# Patient Record
Sex: Male | Born: 2000 | Race: Black or African American | Hispanic: No | Marital: Single | Smoking: Never smoker
Health system: Southern US, Community
[De-identification: ages and names within clinical notes are randomized; demographics above are authoritative.]

---
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
## 2023-01-27 ENCOUNTER — Emergency Department (HOSPITAL_BASED_OUTPATIENT_CLINIC_OR_DEPARTMENT_OTHER)
Admission: EM | Admit: 2023-01-27 | Discharge: 2023-01-27 | Disposition: A | Discharge status: Home / Self Care | Payer: Self-pay | Attending: Emergency Medicine | Admitting: Emergency Medicine

## 2023-01-27 ENCOUNTER — Emergency Department (HOSPITAL_BASED_OUTPATIENT_CLINIC_OR_DEPARTMENT_OTHER): Payer: Self-pay

## 2023-01-27 ENCOUNTER — Other Ambulatory Visit: Payer: Self-pay

## 2023-01-27 DIAGNOSIS — R9431 Abnormal electrocardiogram [ECG] [EKG]: Secondary | ICD-10-CM | POA: Insufficient documentation

## 2023-01-27 LAB — CBC
HCT: 43.4 % (ref 39.0–52.0)
Hemoglobin: 15 g/dL (ref 13.0–17.0)
MCH: 31 pg (ref 26.0–34.0)
MCHC: 34.6 g/dL (ref 30.0–36.0)
MCV: 89.7 fL (ref 80.0–100.0)
Platelets: 278 10*3/uL (ref 150–400)
RBC: 4.84 MIL/uL (ref 4.22–5.81)
RDW: 10.8 % — ABNORMAL LOW (ref 11.5–15.5)
WBC: 4.9 10*3/uL (ref 4.0–10.5)
nRBC: 0 % (ref 0.0–0.2)

## 2023-01-27 LAB — BASIC METABOLIC PANEL
Anion gap: 11 (ref 5–15)
BUN: 14 mg/dL (ref 6–20)
CO2: 22 mmol/L (ref 22–32)
Calcium: 10 mg/dL (ref 8.9–10.3)
Chloride: 101 mmol/L (ref 98–111)
Creatinine, Ser: 1.22 mg/dL (ref 0.61–1.24)
GFR, Estimated: >60 mL/min (ref >60)
Glucose, Bld: 92 mg/dL (ref 70–99)
Potassium: 3.8 mmol/L (ref 3.5–5.1)
Sodium: 134 mmol/L — ABNORMAL LOW (ref 135–145)

## 2023-01-27 LAB — CBG MONITORING, ED: Glucose-Capillary: 76 mg/dL (ref 70–99)

## 2023-01-27 LAB — TROPONIN I (HIGH SENSITIVITY): Troponin I (High Sensitivity): <2 ng/L (ref <18)

## 2023-01-27 MED ORDER — HYDROXYZINE HCL 25 MG PO TABS: 25.0000 mg | ORAL_TABLET | Freq: Four times a day (QID) | ORAL | 0 refills | Status: AC

## 2023-01-27 NOTE — ED Notes (Signed)
Patient feels like his K+ is low as well. EDP made aware

## 2023-01-27 NOTE — ED Notes (Signed)
Patient transported to X-ray 

## 2023-01-27 NOTE — ED Triage Notes (Signed)
Patient presents to ED via POV from home. Here due to a 30-60 second episode of shortness of breath when patient woke up this morning. Denies shortness of breath at this time. Able to speak in full, complete sentences. Denies pain. Denies any complaints.

## 2023-01-27 NOTE — ED Notes (Signed)
Pt states he feels his blood sugar is low, requesting to have it checked

## 2023-01-27 NOTE — Discharge Instructions (Addendum)
You have a slightly abnormal looking EKG however it is nonconcerning for ischemia/damage.  Ultimately I would follow-up with your primary care doctor and discussed the pros and cons of getting an ultrasound of your heart in order to be very thorough.  However, your symptoms do not sound cardiac/heart related.  I suspect that there may be an element of anxiety at play.  I have written you prescription for Atarax/Vistaril which is a medicine you can take for anxiety as needed up to once every 6 hours. Return to the emergency room for any new or concerning symptoms.  If you do not have a primary care provider I have given you the information for the [place] [facility] wellness clinic however if you do have insurance I would recommend calling your insurance company to see who is within network in this area.

## 2023-01-28 NOTE — ED Provider Notes (Signed)
[place] [facility] Provider Note   CSN: [id number] Arrival date & time: 01/27/23  1338     History  Chief Complaint  Patient presents with   Shortness of Breath    Dustin Tanner is a 22 y.o. male.   Shortness of Breath Patient is a 22 year old male with no pertinent past medical history present emergency room today with complaints of several episodes lasting approximately 30 to 60 seconds of dyspnea over the past week.  Seems that the most recent episode was this morning.  Seems that happens once per day particularly in the morning.  No associated chest pain no lightheadedness or dizziness no palpitations.  He has no history of syncope, and he played football in high school.  Denies any leg swelling nausea or vomiting.  No other associate symptoms, he is symptom-free at this time.  He does smoke marijuana but does not smoke cigarettes.     Home Medications Prior to Admission medications   Medication Sig Start Date End Date Taking? Authorizing Provider  hydrOXYzine (ATARAX) 25 MG tablet Take 1 tablet (25 mg total) by mouth every 6 (six) hours. 01/27/23  Yes Gailen Shelter, PA      Allergies    Patient has no known allergies.    Review of Systems   Review of Systems  Respiratory:  Positive for shortness of breath.     Physical Exam Updated Vital Signs BP 120/64 (BP Location: Left Arm)   Pulse 85   Temp 98.7 F (37.1 C) (Oral)   Resp 16   SpO2 99%  Physical Exam Vitals and nursing note reviewed.  Constitutional:      General: He is not in acute distress.    Comments: Pleasant well-appearing 22 year old.  In no acute distress.  Sitting comfortably in bed.  Able answer questions appropriately follow commands. No increased work of breathing. Speaking in full sentences.   HENT:     Head: Normocephalic and atraumatic.     Nose: Nose normal.  Eyes:     General: No scleral icterus. Cardiovascular:     Rate and Rhythm: Normal rate  and regular rhythm.     Pulses: Normal pulses.     Heart sounds: Normal heart sounds.  Pulmonary:     Effort: Pulmonary effort is normal. No respiratory distress.     Breath sounds: No wheezing.  Abdominal:     Palpations: Abdomen is soft.     Tenderness: There is no abdominal tenderness.  Musculoskeletal:     Cervical back: Normal range of motion.     Right lower leg: No edema.     Left lower leg: No edema.  Skin:    General: Skin is warm and dry.     Capillary Refill: Capillary refill takes less than 2 seconds.  Neurological:     Mental Status: He is alert. Mental status is at baseline.  Psychiatric:        Mood and Affect: Mood normal.        Behavior: Behavior normal.     ED Results / Procedures / Treatments   Labs (all labs ordered are listed, but only abnormal results are displayed) Labs Reviewed  BASIC METABOLIC PANEL - Abnormal; Notable for the following components:      Result Value   Sodium 134 (*)    All other components within normal limits  CBC - Abnormal; Notable for the following components:   RDW 10.8 (*)    All other  components within normal limits  CBG MONITORING, ED  TROPONIN I (HIGH SENSITIVITY)    EKG EKG Interpretation Date/Time:  Monday January 27 2023 16:46:10 EDT Ventricular Rate:  78 PR Interval:  158 QRS Duration:  87 QT Interval:  343 QTC Calculation: 391 R Axis:   83  Text Interpretation: Sinus rhythm no significant change since earlier in the day Confirmed by Pricilla Loveless [phone]) on 01/28/2023 7:44:32 AM  Radiology DG Chest 1 View  Result Date: 01/27/2023 CLINICAL DATA:  Shortness of breath. EXAM: CHEST  1 VIEW COMPARISON:  02/12/2015 FINDINGS: The cardiomediastinal contours are normal. The lungs are clear. Pulmonary vasculature is normal. No consolidation, pleural effusion, or pneumothorax. No acute osseous abnormalities are seen. IMPRESSION: Negative radiograph of the chest. Electronically Signed   By: Narda Rutherford M.D.   On:  01/27/2023 17:54    Procedures Procedures    Medications Ordered in ED Medications - No data to display  ED Course/ Medical Decision Making/ A&P                             Medical Decision Making Amount and/or Complexity of Data Reviewed Labs: ordered. Radiology: ordered.  Risk Prescription drug management.   Patient is a 22 year old male with no pertinent past medical history present emergency room today with complaints of several episodes lasting approximately 30 to 60 seconds of dyspnea over the past week.  Seems that the most recent episode was this morning.  Seems that happens once per day particularly in the morning.  No associated chest pain no lightheadedness or dizziness no palpitations.  He has no history of syncope, and he played football in high school.  Denies any leg swelling nausea or vomiting.  No other associate symptoms, he is symptom-free at this time.  He does smoke marijuana but does not smoke cigarettes.  Physical exam is unremarkable regular rate and rhythm no murmurs rubs or gallops.  He is well-appearing no calf swelling or tenderness  His chest x-ray is unremarkable no infiltrates or asymmetry of the lungs.  Cardiac contour is normal.  EKG with some inferior Q waves and some LVH but otherwise normal.  Basic labs and troponin are normal.  Vital signs normal  Patient is symptom-free and I believe that he is reasonable to go home with follow-up with primary care -I do not feel that he specifically needs to see a cardiologist however he may benefit from an echocardiogram in the future.  Return precautions discussed.  I discussed this case with my supervising physician who agrees with my plan.  Will discharge home with Atarax as some of his symptoms may be anxiety related.  EKG slightly abnormal however this is perhaps unrelated to his symptoms.  I was able to obtain an EKG from 2016 which appears quite similar.  Ultimately outpatient follow-up and monitoring  his symptoms are next steps.  Final Clinical Impression(s) / ED Diagnoses Final diagnoses:  Abnormal EKG    Rx / DC Orders ED Discharge Orders          Ordered    hydrOXYzine (ATARAX) 25 MG tablet  Every 6 hours        01/27/23 1853              Gailen Shelter, [place] 01/28/23 1210    Derwood Kaplan, MD 01/28/23 [phone]
